# Patient Record
Sex: Male | Born: 1935 | Race: White | Hispanic: No | Marital: Married
Health system: Southern US, Community
[De-identification: ages and names within clinical notes are randomized; demographics above are authoritative.]

---

## 2004-07-29 ENCOUNTER — Emergency Department: Payer: Self-pay | Admitting: Emergency Medicine

## 2004-07-30 ENCOUNTER — Emergency Department: Payer: Self-pay | Admitting: Emergency Medicine

## 2004-08-31 ENCOUNTER — Ambulatory Visit: Payer: Self-pay | Admitting: Ophthalmology

## 2004-09-06 ENCOUNTER — Ambulatory Visit: Payer: Self-pay | Admitting: Ophthalmology

## 2005-02-08 ENCOUNTER — Other Ambulatory Visit: Payer: Self-pay

## 2005-02-09 ENCOUNTER — Inpatient Hospital Stay: Payer: Self-pay | Admitting: Internal Medicine

## 2007-06-07 ENCOUNTER — Ambulatory Visit: Payer: Self-pay | Admitting: Internal Medicine

## 2008-06-18 ENCOUNTER — Inpatient Hospital Stay: Payer: Self-pay | Admitting: Internal Medicine

## 2008-06-18 ENCOUNTER — Other Ambulatory Visit: Payer: Self-pay

## 2009-03-10 ENCOUNTER — Encounter: Payer: Self-pay | Admitting: Family Medicine

## 2009-04-09 ENCOUNTER — Encounter: Payer: Self-pay | Admitting: Family Medicine

## 2010-10-12 ENCOUNTER — Ambulatory Visit: Payer: Self-pay | Admitting: Gastroenterology

## 2011-04-11 ENCOUNTER — Emergency Department: Payer: Self-pay | Admitting: Emergency Medicine

## 2011-11-18 IMAGING — CT CT HEAD WITHOUT CONTRAST
2 series · 15 of 30 positions shown, 19 images · non-contrast
Comparison: none

REASON FOR EXAM: headache elevated BP
COMMENTS:

PROCEDURE:     CT  - CT HEAD WITHOUT CONTRAST  - April 11, 2011  [DATE]
RESULT:     Head CT dated 04/11/2011 comparison made to prior study dated
06/18/2008.
TECHNIQUE: Helical noncontrasted 5 mm sections were obtained from the skull
base to the vertex.

[Series 2: without · axial · non-contrast · 0.41mm/px · z∈[-167,-37]mm · 13 of 32 slices shown, 17 images]
[im 3/32  brain]
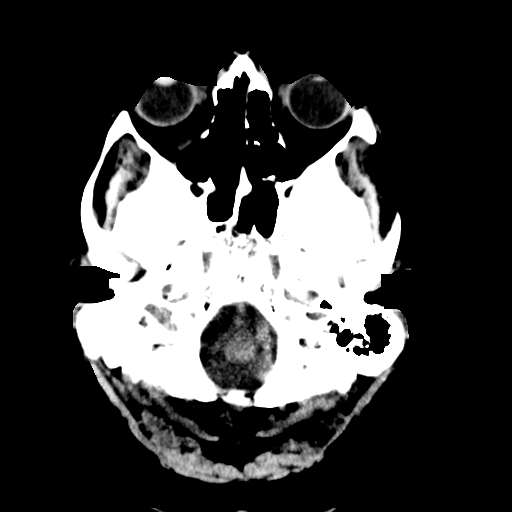
[im 3/32  bone]
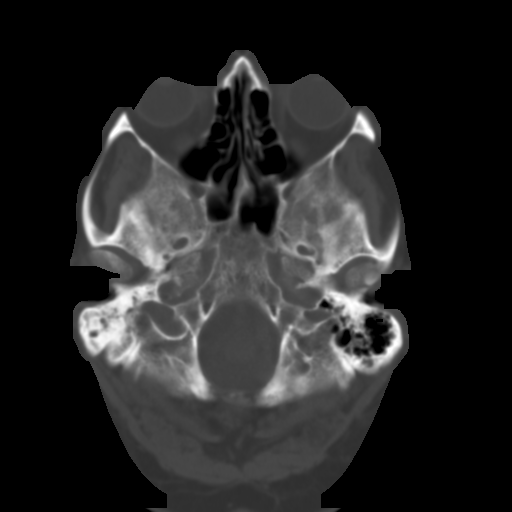
[im 5/32  brain]
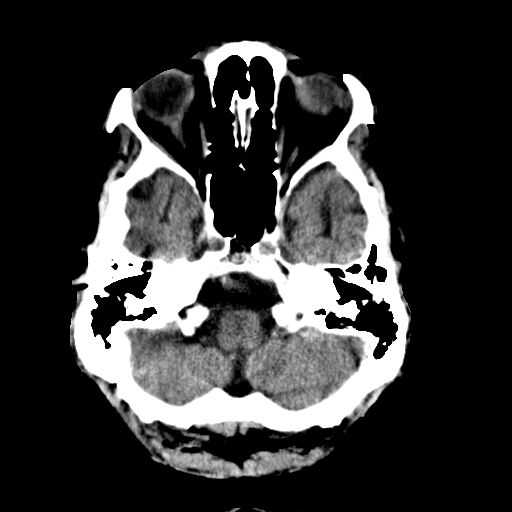
[im 7/32  brain]
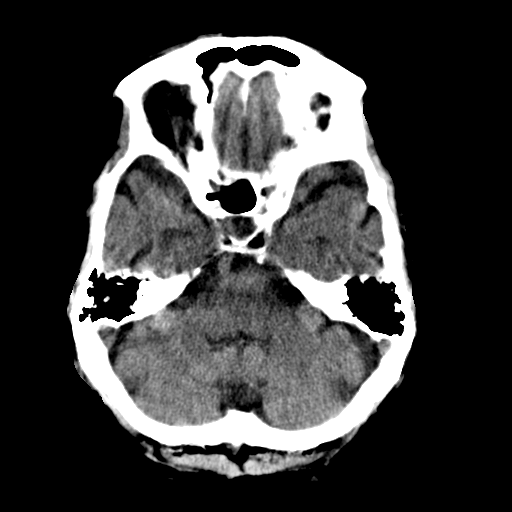
[im 9/32  brain]
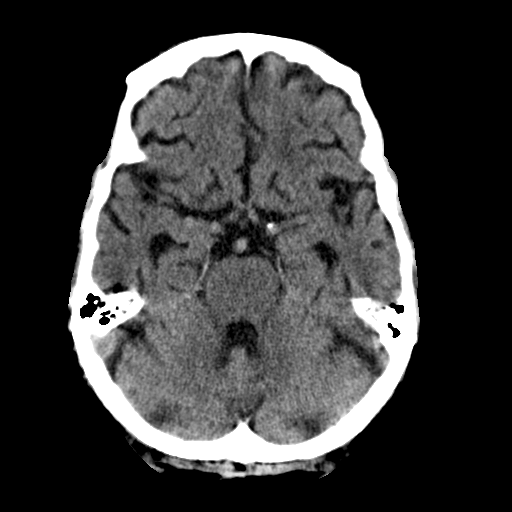
[im 12/32  brain]
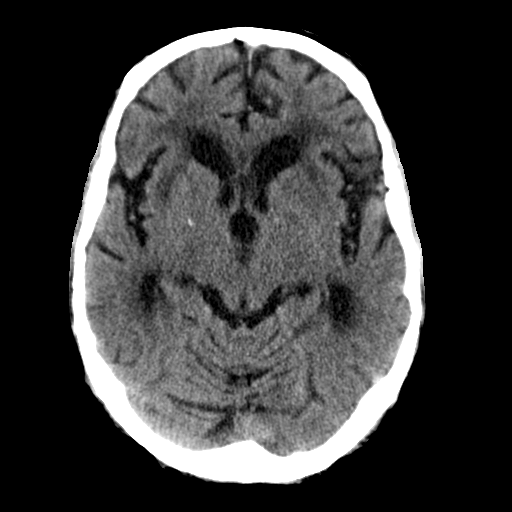
[im 12/32  bone]
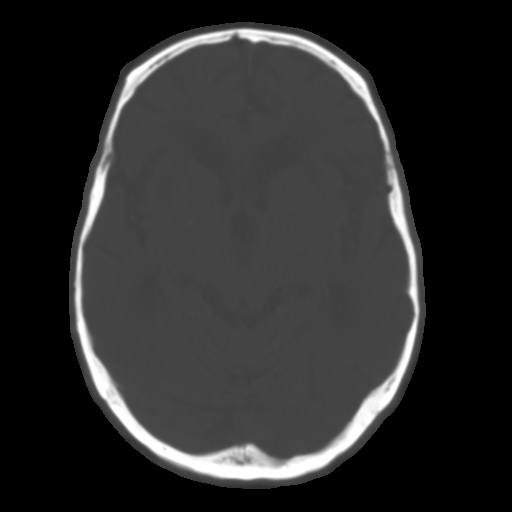
[im 14/32  brain]
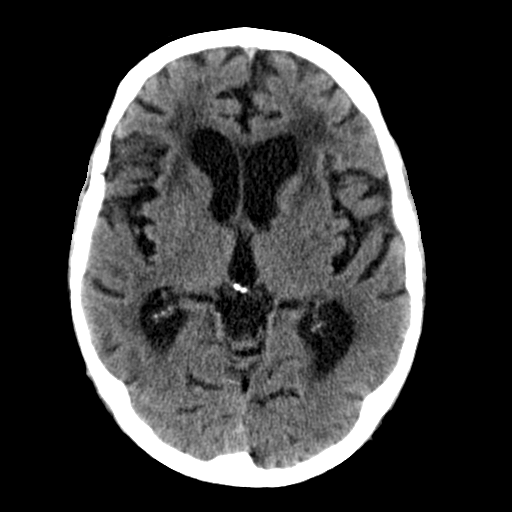
[im 16/32  brain]
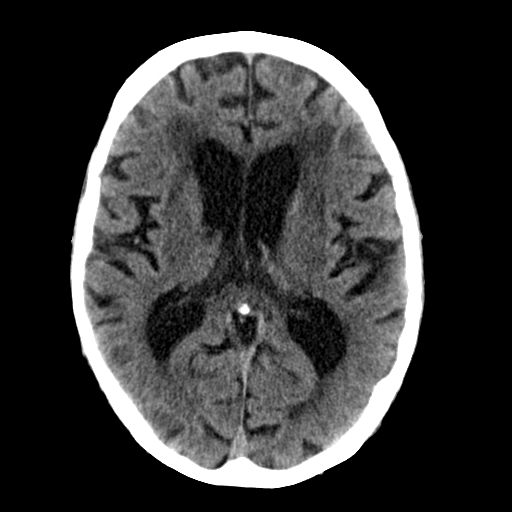
[im 18/32  brain]
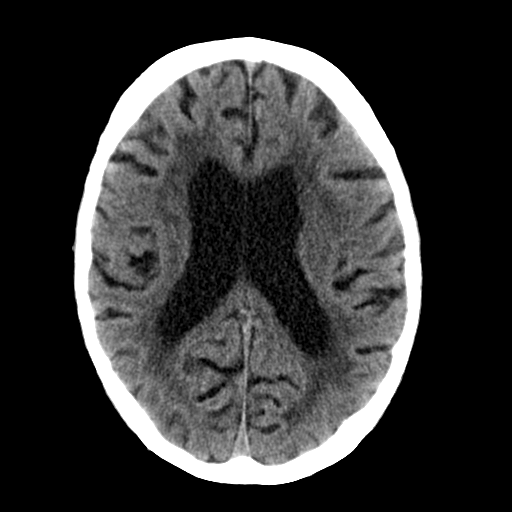
[im 20/32  brain]
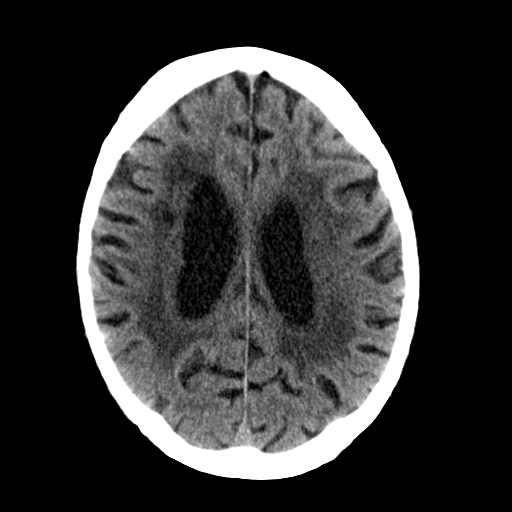
[im 20/32  bone]
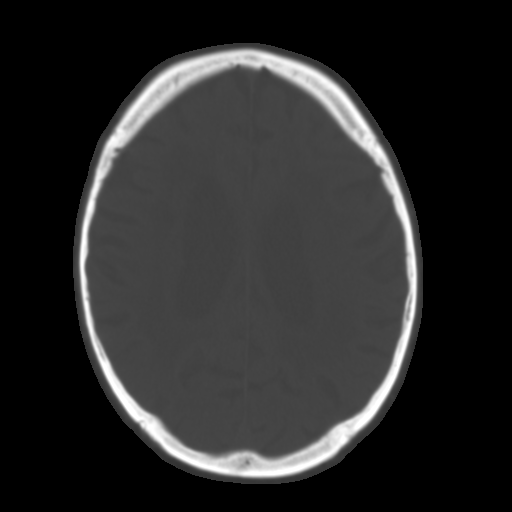
[im 23/32  brain]
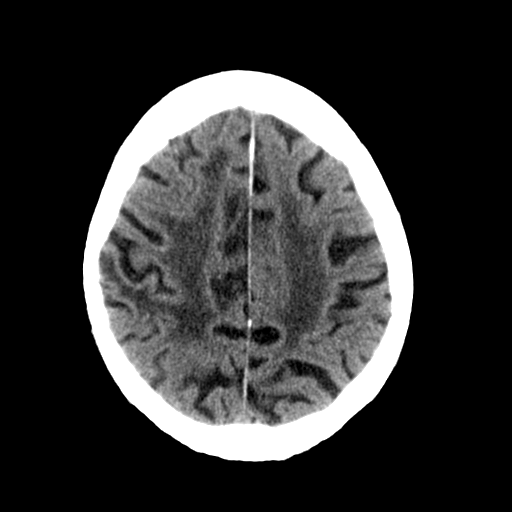
[im 25/32  brain]
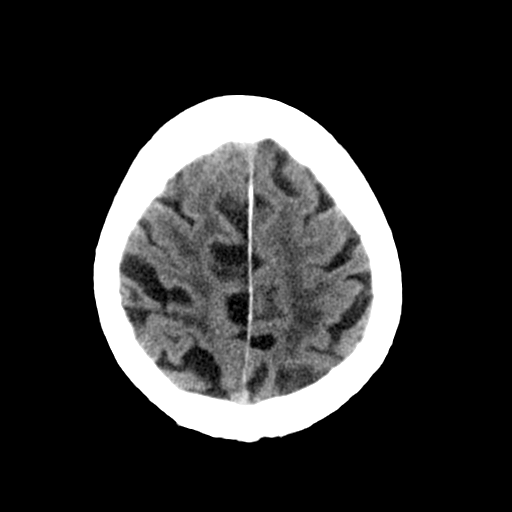
[im 27/32  brain]
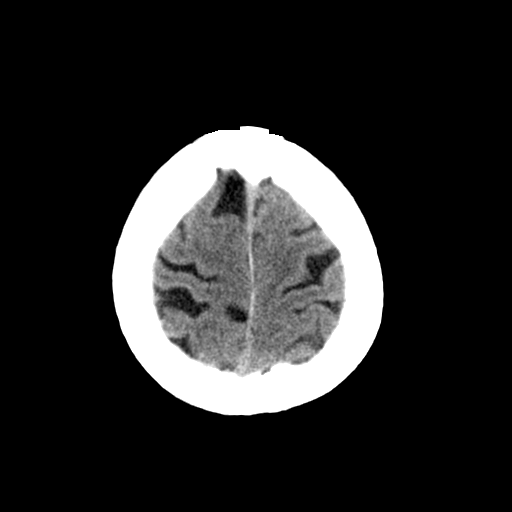
[im 29/32  brain]
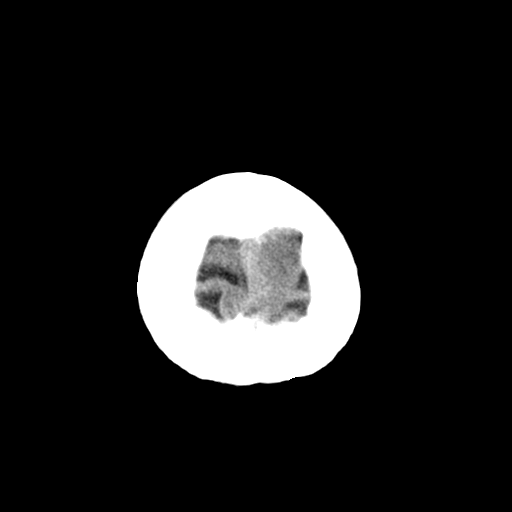
[im 29/32  bone]
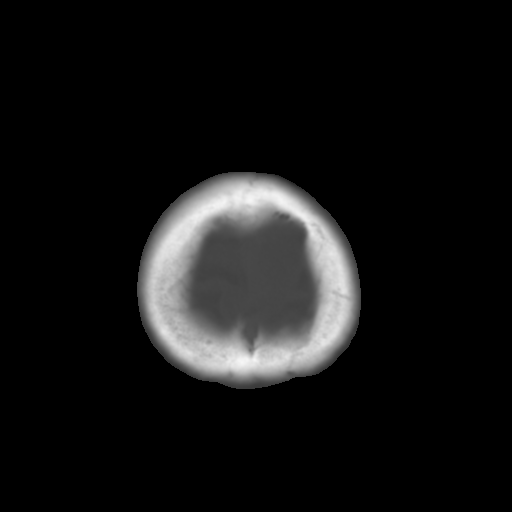

[Series 3: bone · axial · 0.41mm/px · z∈[-167,-147]mm · 2 of 32 slices shown]
[im 3/32  bone]
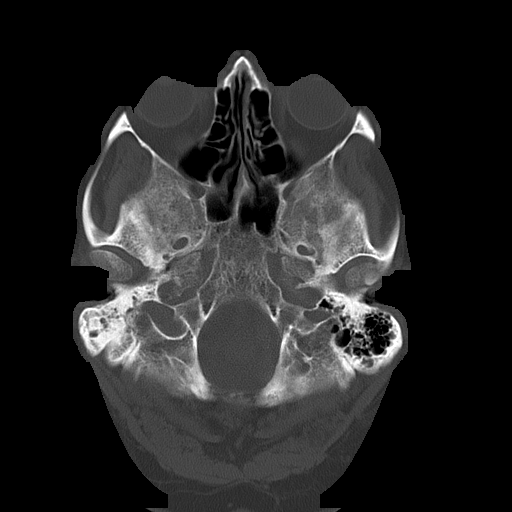
[im 7/32  bone]
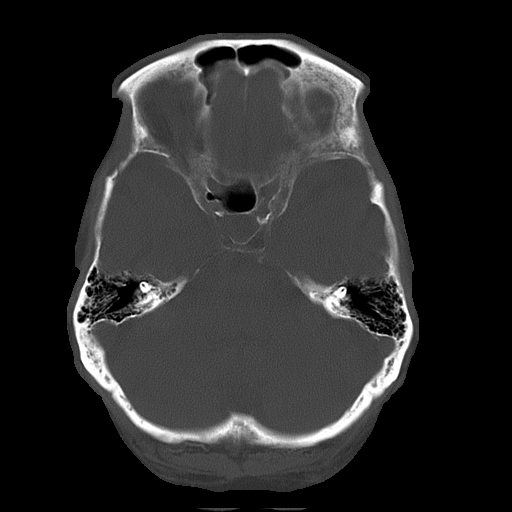

[15 of 30 positions shown; findings below may reference images not displayed]

FINDINGS: There is no evidence of intra-axial nor extra-axial fluid
collections nor evidence of acute hemorrhage. Areas of severe low
attenuation project within the subcortical, deep, and periventricular white
matter regions. There is diffuse cortical atrophy. Basal ganglial
calcifications also identified. Hydrocephalus ex vacuo is identified. There
is no evidence of intra-axial nor extra-axial fluid collections nor evidence
of acute hemorrhage. There is no evidence of subfalcine nor tonsillar
herniation. There is cerebellar atrophy. A focal area of low-attenuation
projects in the posterior in paramedial aspect of the right cerebellar
hemisphere. Finding may reflect sequela of the region of chronic infarction.
Alternatively findings secondary to atrophy.
IMPRESSION: Moderate to severe involutional changes as described above
without evidence of acute abnormalities.

## 2013-06-11 ENCOUNTER — Emergency Department: Payer: Self-pay | Admitting: Emergency Medicine

## 2013-07-15 ENCOUNTER — Ambulatory Visit: Payer: Self-pay | Admitting: Urology

## 2013-09-23 ENCOUNTER — Ambulatory Visit: Payer: Self-pay | Admitting: Cardiology

## 2013-09-23 LAB — BASIC METABOLIC PANEL
Anion Gap: 4 — ABNORMAL LOW (ref 7–16)
BUN: 13 mg/dL (ref 7–18)
Calcium, Total: 9.3 mg/dL (ref 8.5–10.1)
Chloride: 106 mmol/L (ref 98–107)
EGFR (African American): >60
Glucose: 96 mg/dL (ref 65–99)
Potassium: 3.8 mmol/L (ref 3.5–5.1)

## 2013-09-23 LAB — URINALYSIS, COMPLETE
Bacteria: NONE SEEN
Blood: NEGATIVE
Ketone: NEGATIVE
Leukocyte Esterase: NEGATIVE
Protein: NEGATIVE
Specific Gravity: 1.011 (ref 1.003–1.030)

## 2013-09-23 LAB — CBC WITH DIFFERENTIAL/PLATELET
Basophil #: 0.1 10*3/uL (ref 0.0–0.1)
Eosinophil %: 1.1 %
HCT: 41.2 % (ref 40.0–52.0)
HGB: 13.9 g/dL (ref 13.0–18.0)
Lymphocyte #: 1.3 10*3/uL (ref 1.0–3.6)
Lymphocyte %: 20.7 %
MCH: 31.9 pg (ref 26.0–34.0)
MCHC: 33.8 g/dL (ref 32.0–36.0)
MCV: 95 fL (ref 80–100)
Monocyte #: 0.9 x10 3/mm (ref 0.2–1.0)
Monocyte %: 15.2 %
Neutrophil %: 62 %
RDW: 13.3 % (ref 11.5–14.5)

## 2013-09-23 LAB — PROTIME-INR: INR: 1

## 2013-09-27 ENCOUNTER — Ambulatory Visit: Payer: Self-pay | Admitting: Cardiology

## 2013-12-08 DEATH — deceased

## 2014-02-21 IMAGING — CR DG ABDOMEN 1V
1 series · 2 of 2 positions shown · non-contrast
Comparison: none

REASON FOR EXAM: nephrolothasis, renal colic
COMMENTS:

[Series 1: ap · 0.17mm/px · 2 of 2 slices shown]
[im 1/2]
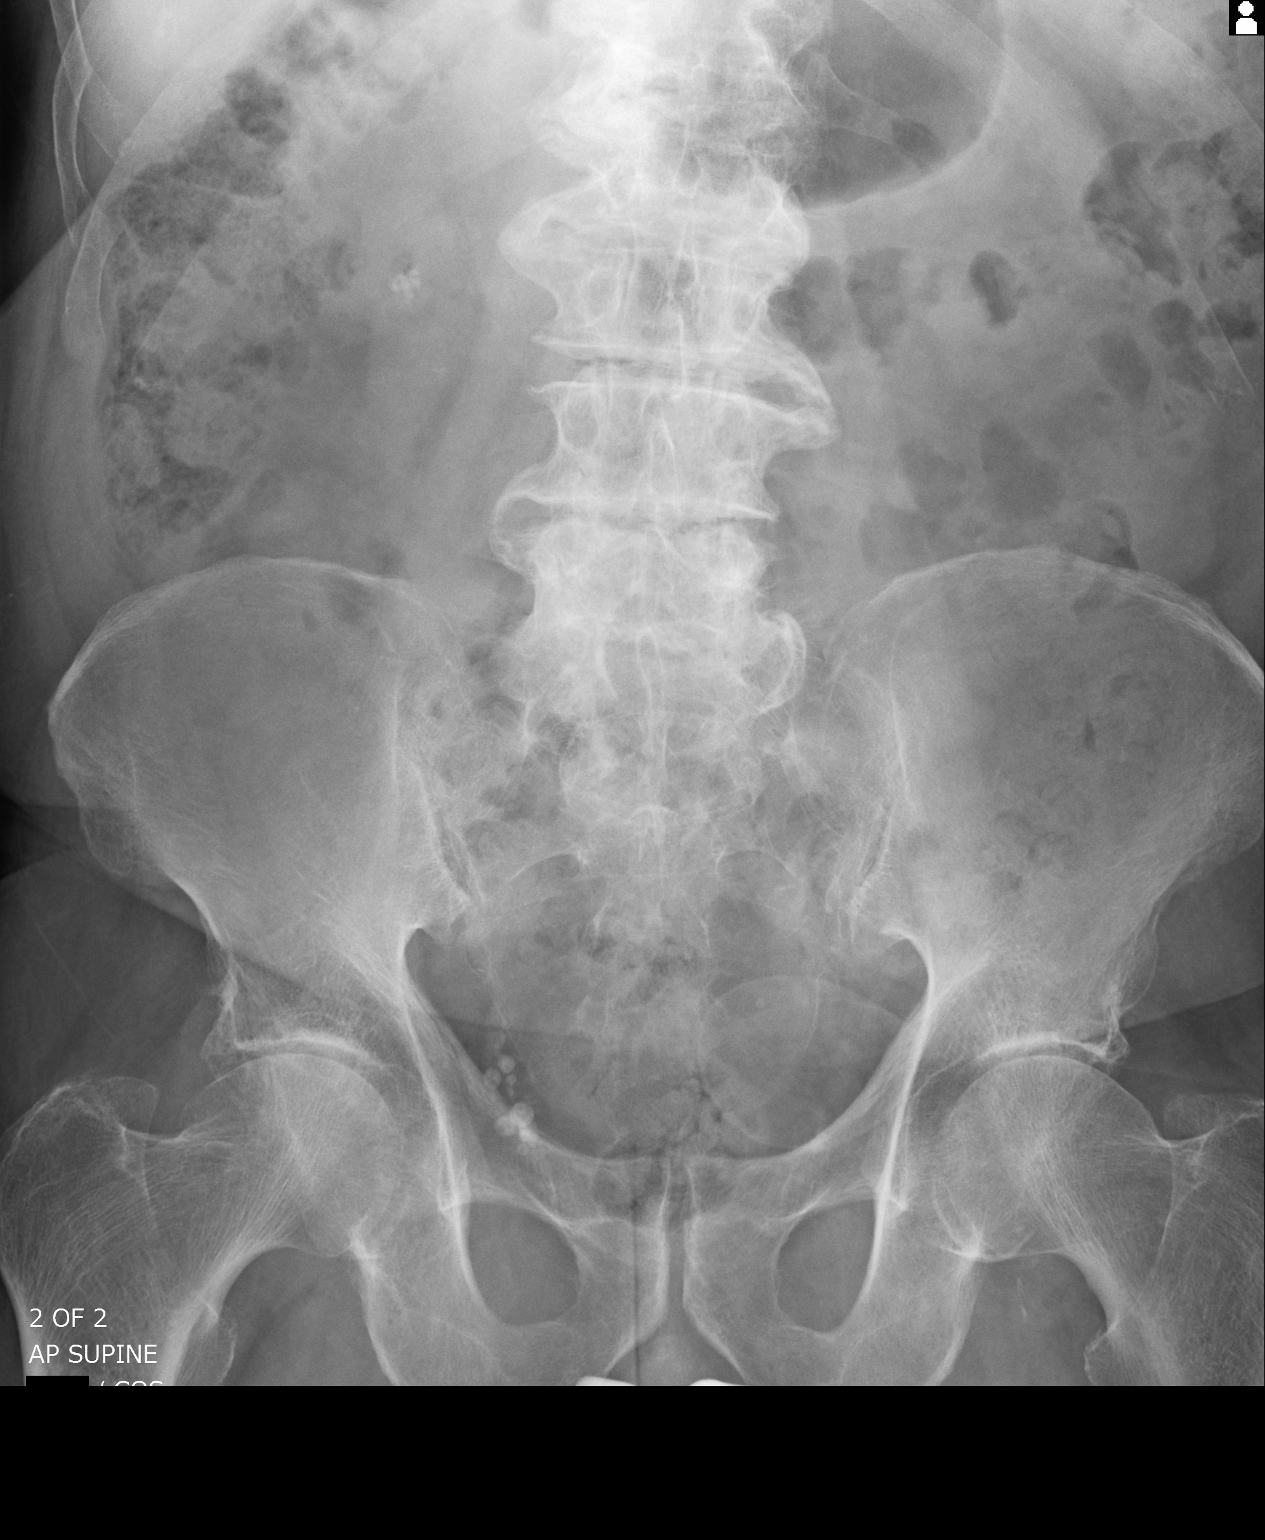
[im 2/2]
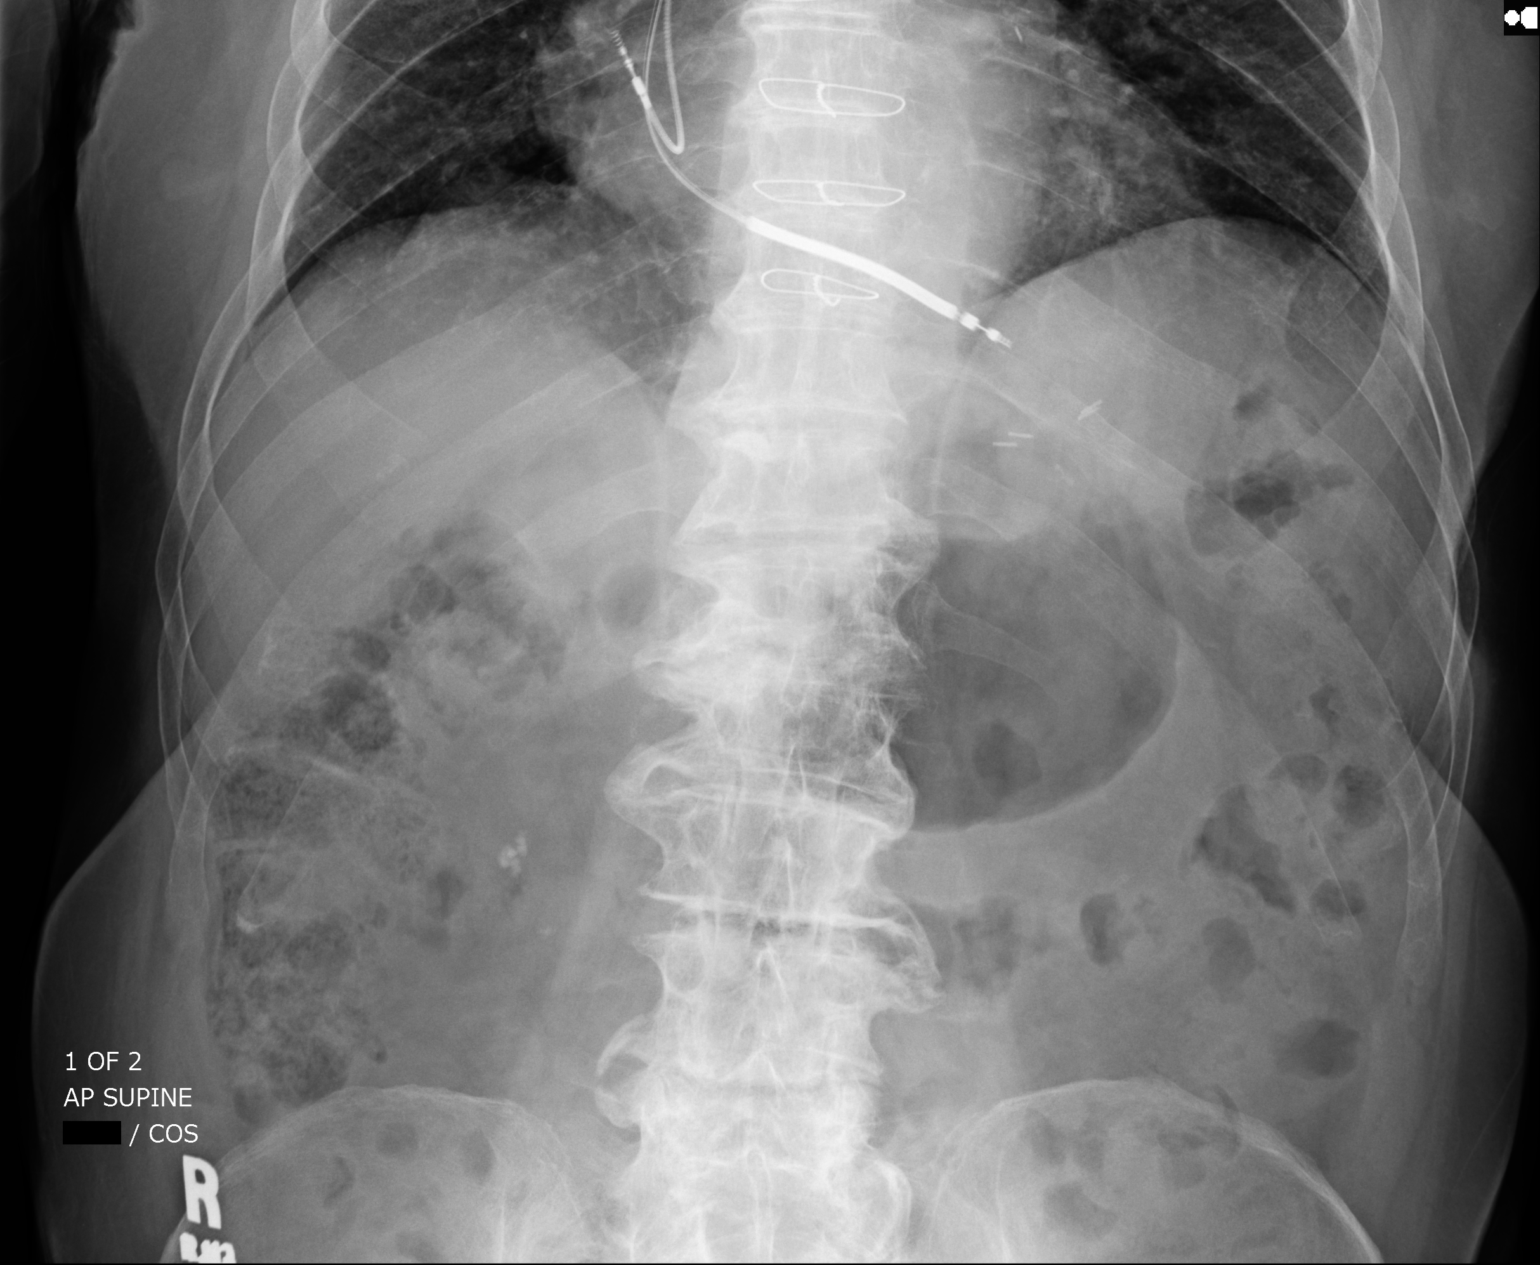

[2 of 2 positions shown; findings below may reference images not displayed]

PROCEDURE:     DXR - DXR KIDNEY URETER BLADDER  - July 15, 2013  [DATE]

RESULT:     There are degenerative changes in the spine. Multiple calcific
densities are present over the right kidney region suggestive of stones. The
bowel gas pattern is unremarkable. The left kidney is poorly seen without
definite stones. Pacemaker leads are present.
IMPRESSION: Right nephrolithiasis. Multiple phleboliths appear present.

[REDACTED]

## 2015-01-31 NOTE — Op Note (Signed)
PATIENT NAME:  Anthony Robinson, Anthony Robinson MR#:  [id number] DATE OF BIRTH:  [birth year]  DATE OF PROCEDURE:  09/27/2013  TITLE OF PROCEDURE: Implantation of a new dual-chamber implantable cardioverter defibrillator.   INDICATION: Old device at elective replacement indicator.   PREVIOUS INDICATION FOR INITIAL IMPLANT: Sustained ventricular tachycardia, secondary prevention indication.   DETAILS OF PROCEDURE: Written informed consent was obtained and placed in the patient's permanent medical record. The patient was brought to the Operating Room in a fasting, nonsedated state. The appropriate resuscitative equipment was attached to the patient. IVs were established. Anesthesia was available and performed, conscious sedation. The patient was prepped and draped in the usual sterile manner. The area of the right infraclavicular fossa was scrubbed with chlorhexidine. Using local anesthesia, 1% lidocaine, the area of the right infraclavicular fossa was anesthetized. Using a scalpel, an incision was made over the old implantable cardiac defibrillator insertion site. Using a combination of electrocautery and blunt dissection, the implantable cardiac defibrillator was extracted from the pocket and the leads were dissected free. The pocket was then revised to accommodate the new device. Irrigation was performed with gentamicin. Adequate hemostasis was confirmed. The leads were tested and showed the following: P waves 4.1 mV, R waves greater than 20 mV, impedance on the atrial lead was 456, RV lead 457, RV coil impedance was 47. SVC was 56. Threshold was 0.5 v at 0.4 ms on the RV lead. Atrial lead threshold was 0.75 at 0.4. The new device was subsequently connected to the leads and was placed in the pocket. The pocket was then closed with running layers of 2-0 and 3-0 Vicryl and a subcuticular layer of 4-0 Vicryl. Steri-Strips were applied over the incision site, as well as sterile gauze and OpSite.   SUMMARY OF IMPLANTED  HARDWARE: The patient received a Medtronic dual chamber implantable cardiac defibrillator, model Evera XTDR, model number [id number], serial number [id number]. The right atrial lead is a Medtronic [id number], which was implanted in 2008, serial number [id number]. The RV lead is a Medtronic [id number], implanted in January 2008, and the serial is [id number]. This device was programmed as follows: Huston FoleyBrady therapies mode AI DDD, with the lower rate limit of 50, upper tracking rate 130, upper sensory rate 130, paced AV delay 180, sensed AV delay 150. Outputs for the atrial and RV lead was 3.5 and 0.4. VF detection was at 188 with 30 out of 40. VT monitor was at 158.   There were no complications. No fluoroscopy was utilized. Estimated blood loss was minimal.     ____________________________ Anna GenreKevin L. Maisie Fushomas, MD klt:cg D: 09/27/2013 14:52:26 ET T: 09/27/2013 23:20:54 ET JOB#: [id number]  cc: Caryn BeeKevin L. Maisie Fushomas, MD, <Dictator> Sharion SettlerKEVIN L THOMAS MD ELECTRONICALLY SIGNED 11/04/2013 15:49
